# Patient Record
Sex: Male | Born: 1970 | Race: White | Hispanic: No | Marital: Single | Smoking: Current every day smoker
Health system: Southern US, Community
[De-identification: ages and names within clinical notes are randomized; demographics above are authoritative.]

## PROBLEM LIST (undated history)

## (undated) DIAGNOSIS — F1120 Opioid dependence, uncomplicated: Secondary | ICD-10-CM

## (undated) HISTORY — PX: HAND SURGERY: SHX662

---

## 1999-03-21 ENCOUNTER — Emergency Department (HOSPITAL_COMMUNITY): Admission: EM | Admit: 1999-03-21 | Discharge: 1999-03-21 | Payer: Self-pay | Admitting: Emergency Medicine

## 2014-02-15 ENCOUNTER — Emergency Department (HOSPITAL_COMMUNITY): Payer: Self-pay

## 2014-02-15 ENCOUNTER — Emergency Department (HOSPITAL_COMMUNITY)
Admission: EM | Admit: 2014-02-15 | Discharge: 2014-02-15 | Disposition: A | Discharge status: Home / Self Care | Payer: Self-pay | Attending: Emergency Medicine | Admitting: Emergency Medicine

## 2014-02-15 ENCOUNTER — Encounter (HOSPITAL_COMMUNITY): Payer: Self-pay | Admitting: *Deleted

## 2014-02-15 DIAGNOSIS — S3991XA Unspecified injury of abdomen, initial encounter: Secondary | ICD-10-CM | POA: Insufficient documentation

## 2014-02-15 DIAGNOSIS — Z720 Tobacco use: Secondary | ICD-10-CM | POA: Insufficient documentation

## 2014-02-15 DIAGNOSIS — S79922A Unspecified injury of left thigh, initial encounter: Secondary | ICD-10-CM | POA: Insufficient documentation

## 2014-02-15 DIAGNOSIS — M545 Low back pain, unspecified: Secondary | ICD-10-CM

## 2014-02-15 DIAGNOSIS — Y92410 Unspecified street and highway as the place of occurrence of the external cause: Secondary | ICD-10-CM | POA: Insufficient documentation

## 2014-02-15 DIAGNOSIS — Y998 Other external cause status: Secondary | ICD-10-CM | POA: Insufficient documentation

## 2014-02-15 DIAGNOSIS — Y9389 Activity, other specified: Secondary | ICD-10-CM | POA: Insufficient documentation

## 2014-02-15 DIAGNOSIS — S3992XA Unspecified injury of lower back, initial encounter: Secondary | ICD-10-CM | POA: Insufficient documentation

## 2014-02-15 DIAGNOSIS — S79912A Unspecified injury of left hip, initial encounter: Secondary | ICD-10-CM | POA: Insufficient documentation

## 2014-02-15 DIAGNOSIS — S93402A Sprain of unspecified ligament of left ankle, initial encounter: Secondary | ICD-10-CM | POA: Insufficient documentation

## 2014-02-15 HISTORY — DX: Opioid dependence, uncomplicated: F11.20

## 2014-02-15 LAB — COMPREHENSIVE METABOLIC PANEL
ALT: 21 U/L (ref 0–53)
ANION GAP: 4 — AB (ref 5–15)
AST: 24 U/L (ref 0–37)
Albumin: 3.7 g/dL (ref 3.5–5.2)
Alkaline Phosphatase: 74 U/L (ref 39–117)
BUN: 11 mg/dL (ref 6–23)
CO2: 27 mmol/L (ref 19–32)
Calcium: 9 mg/dL (ref 8.4–10.5)
Chloride: 104 mmol/L (ref 96–112)
Creatinine, Ser: 0.91 mg/dL (ref 0.50–1.35)
Glucose, Bld: 84 mg/dL (ref 70–99)
POTASSIUM: 4 mmol/L (ref 3.5–5.1)
SODIUM: 135 mmol/L (ref 135–145)
Total Bilirubin: 0.6 mg/dL (ref 0.3–1.2)
Total Protein: 6.9 g/dL (ref 6.0–8.3)

## 2014-02-15 LAB — URINALYSIS, ROUTINE W REFLEX MICROSCOPIC
Bilirubin Urine: NEGATIVE
GLUCOSE, UA: NEGATIVE mg/dL
Hgb urine dipstick: NEGATIVE
Ketones, ur: NEGATIVE mg/dL
Leukocytes, UA: NEGATIVE
Nitrite: NEGATIVE
PH: 5.5 (ref 5.0–8.0)
PROTEIN: NEGATIVE mg/dL
SPECIFIC GRAVITY, URINE: 1.038 — AB (ref 1.005–1.030)
Urobilinogen, UA: 0.2 mg/dL (ref 0.0–1.0)

## 2014-02-15 LAB — CBC
HCT: 38.3 % — ABNORMAL LOW (ref 39.0–52.0)
Hemoglobin: 12.9 g/dL — ABNORMAL LOW (ref 13.0–17.0)
MCH: 29.4 pg (ref 26.0–34.0)
MCHC: 33.7 g/dL (ref 30.0–36.0)
MCV: 87.2 fL (ref 78.0–100.0)
Platelets: 183 10*3/uL (ref 150–400)
RBC: 4.39 MIL/uL (ref 4.22–5.81)
RDW: 13.8 % (ref 11.5–15.5)
WBC: 7.1 10*3/uL (ref 4.0–10.5)

## 2014-02-15 MED ORDER — NAPROXEN 500 MG PO TABS: 500.0000 mg | ORAL_TABLET | Freq: Two times a day (BID) | ORAL | Status: AC

## 2014-02-15 MED ORDER — OXYCODONE-ACETAMINOPHEN 5-325 MG PO TABS: 1.0000 | ORAL_TABLET | Freq: Four times a day (QID) | ORAL | Status: AC | PRN

## 2014-02-15 MED ORDER — IOHEXOL 300 MG/ML  SOLN
100.0000 mL | Freq: Once | INTRAMUSCULAR | Status: AC | PRN
  Administered 2014-02-15: 100 mL via INTRAVENOUS

## 2014-02-15 MED ORDER — HYDROMORPHONE HCL 1 MG/ML IJ SOLN
1.0000 mg | Freq: Once | INTRAMUSCULAR | Status: AC
  Administered 2014-02-15: 1 mg via INTRAVENOUS
  Filled 2014-02-15: qty 1

## 2014-02-15 MED ORDER — METHOCARBAMOL 500 MG PO TABS: 500.0000 mg | ORAL_TABLET | Freq: Four times a day (QID) | ORAL | Status: AC

## 2014-02-15 MED ORDER — ONDANSETRON HCL 4 MG/2ML IJ SOLN
4.0000 mg | Freq: Once | INTRAMUSCULAR | Status: AC
  Administered 2014-02-15: 4 mg via INTRAVENOUS
  Filled 2014-02-15: qty 2
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
## 2014-02-15 NOTE — ED Notes (Signed)
c-collar removed per dr Elesa MassedWard
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
## 2021-02-18 DEATH — deceased
